# Patient Record
Sex: Male | Born: 1995 | Hispanic: Yes | Marital: Single | Smoking: Never smoker
Health system: Southern US, Community
[De-identification: ages and names within clinical notes are randomized; demographics above are authoritative.]

## PROBLEM LIST (undated history)

## (undated) ENCOUNTER — Emergency Department (HOSPITAL_COMMUNITY)

## (undated) ENCOUNTER — Ambulatory Visit (HOSPITAL_COMMUNITY): Payer: Self-pay

## (undated) DIAGNOSIS — J45909 Unspecified asthma, uncomplicated: Secondary | ICD-10-CM

## (undated) HISTORY — DX: Unspecified asthma, uncomplicated: J45.909

---
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
## 2016-04-17 ENCOUNTER — Ambulatory Visit (INDEPENDENT_AMBULATORY_CARE_PROVIDER_SITE_OTHER): Payer: Self-pay | Admitting: Physician Assistant

## 2016-04-17 ENCOUNTER — Encounter: Payer: Self-pay | Admitting: Physician Assistant

## 2016-04-17 VITALS — BP 125/69 | HR 82 | Ht 66.0 in | Wt 171.0 lb

## 2016-04-17 DIAGNOSIS — J4520 Mild intermittent asthma, uncomplicated: Secondary | ICD-10-CM | POA: Insufficient documentation

## 2016-04-17 DIAGNOSIS — J069 Acute upper respiratory infection, unspecified: Secondary | ICD-10-CM

## 2016-04-17 MED ORDER — ALBUTEROL SULFATE HFA 108 (90 BASE) MCG/ACT IN AERS: 1.0000 | INHALATION_SPRAY | RESPIRATORY_TRACT | Status: DC | PRN

## 2016-04-17 MED ORDER — AZITHROMYCIN 250 MG PO TABS: ORAL_TABLET | ORAL | Status: DC

## 2016-04-17 NOTE — Progress Notes (Signed)
   Subjective:    Patient ID: Charles Mcneil, male    DOB: [birth year], 20 y.o.   MRN: [id number]  HPI Pt is a 20 yo male who presents to the clinic with cough and wheezing. He has history of asthma. He uses a rescue inhaler as needed. He is using it more frequently this week. He is almost out of it. He denies any fever, chills, SOB. His chest does feel tight. Cough is productive with clear sputum. Not taking anything OTC. Symptoms for about 1 week. 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   .. Family History  Problem Relation Age of Onset  . Diabetes Mother   . Alcohol abuse Father    .Marland Kitchen. Social History   Social History  . Marital Status: Single    Spouse Name: N/A  . Number of Children: N/A  . Years of Education: N/A   Occupational History  . Not on file.   Social History Main Topics  . Smoking status: Never Smoker   . Smokeless tobacco: Not on file  . Alcohol Use: 0.0 oz/week    0 Standard drinks or equivalent per week  . Drug Use: Yes    Special: Marijuana  . Sexual Activity: Yes   Other Topics Concern  . Not on file   Social History Narrative  . No narrative on file      Review of Systems  All other systems reviewed and are negative.      Objective:   Physical Exam  Constitutional: He is oriented to person, place, and time. He appears well-developed and well-nourished.  HENT:  Head: Normocephalic and atraumatic.  Right Ear: External ear normal.  Left Ear: External ear normal.  Nose: Nose normal.  Mouth/Throat: Oropharynx is clear and moist. No oropharyngeal exudate.  TM's normal.   Eyes: Conjunctivae are normal. Right eye exhibits no discharge. Left eye exhibits no discharge.  Neck: Normal range of motion. Neck supple.  Cardiovascular: Normal rate, regular rhythm and normal heart sounds.   Pulmonary/Chest: Effort normal and breath sounds normal. He has no wheezes.  Lymphadenopathy:    He has no cervical adenopathy.   Neurological: He is alert and oriented to person, place, and time.  Psychiatric: He has a normal mood and affect. His behavior is normal.          Assessment & Plan:  Asthma, intermittent/acute URI- refilled albuterol inhaler. Discussed likely allergic from fiberglass. Consider claritin daily and mucinex as needed for next few days. If not improving zpak given to pull out in the next 2-3 days.

## 2016-04-17 NOTE — Patient Instructions (Signed)
mucinex to help with congestion and sinus pressure.  claritin to help with allergies  If not improving then use abx.

## 2018-07-24 ENCOUNTER — Encounter: Payer: Self-pay | Admitting: Physician Assistant

## 2018-07-24 ENCOUNTER — Ambulatory Visit (INDEPENDENT_AMBULATORY_CARE_PROVIDER_SITE_OTHER): Payer: Self-pay | Admitting: Physician Assistant

## 2018-07-24 VITALS — BP 126/76 | HR 71 | Ht 66.0 in | Wt 180.0 lb

## 2018-07-24 DIAGNOSIS — Z131 Encounter for screening for diabetes mellitus: Secondary | ICD-10-CM

## 2018-07-24 DIAGNOSIS — Z0000 Encounter for general adult medical examination without abnormal findings: Secondary | ICD-10-CM

## 2018-07-24 DIAGNOSIS — J4520 Mild intermittent asthma, uncomplicated: Secondary | ICD-10-CM

## 2018-07-24 DIAGNOSIS — Z13220 Encounter for screening for lipoid disorders: Secondary | ICD-10-CM

## 2018-07-24 DIAGNOSIS — N4889 Other specified disorders of penis: Secondary | ICD-10-CM

## 2018-07-24 MED ORDER — ALBUTEROL SULFATE HFA 108 (90 BASE) MCG/ACT IN AERS: 1.0000 | INHALATION_SPRAY | RESPIRATORY_TRACT | 2 refills | Status: AC | PRN

## 2018-07-24 MED ORDER — MONTELUKAST SODIUM 10 MG PO TABS: 10.0000 mg | ORAL_TABLET | Freq: Every day | ORAL | 3 refills | Status: DC

## 2018-07-24 NOTE — Progress Notes (Signed)
   Subjective:    Patient ID: Charles Mcneil, male    DOB: 1996/07/06, 22 y.o.   MRN: [id number]  HPI  Pt is a 22 yo male who presents to the clinic for CPE.   He has asthma and using inhaler for rescue 2-3 times a week.  He mentions small bumps around the head of penis. They do not hurt, itch or painful. Noticed since he was in middle school. Not tried anything to make better.  No other problems or concerns.   .. Family History  Problem Relation Age of Onset  . Diabetes Mother   . Alcohol abuse Father   . Hypertension Father      Review of Systems  All other systems reviewed and are negative.      Objective:   Physical Exam  Constitutional: He is oriented to person, place, and time. He appears well-developed and well-nourished.  HENT:  Head: Normocephalic and atraumatic.  Right Ear: External ear normal.  Left Ear: External ear normal.  Nose: Nose normal.  Mouth/Throat: Oropharynx is clear and moist.  Eyes: Pupils are equal, round, and reactive to light. Conjunctivae and EOM are normal.  Neck: Normal range of motion. Neck supple.  Cardiovascular: Normal rate and regular rhythm.  Pulmonary/Chest: Effort normal and breath sounds normal.  Abdominal: Soft. Bowel sounds are normal.  Genitourinary: No penile tenderness.  Genitourinary Comments: Small papule around head of penis.   Musculoskeletal: Normal range of motion.  Lymphadenopathy:    He has no cervical adenopathy.  Neurological: He is alert and oriented to person, place, and time.  Psychiatric: He has a normal mood and affect. His behavior is normal.          Assessment & Plan:  Marland Kitchen.Marland Kitchen.Diagnoses and all orders for this visit:  Routine physical examination -     Lipid Panel w/reflex Direct LDL -     COMPLETE METABOLIC PANEL WITH GFR  Screening for lipid disorders -     Lipid Panel w/reflex Direct LDL  Screening for diabetes mellitus -     COMPLETE METABOLIC PANEL WITH GFR  Mild intermittent  asthma without complication -     albuterol (PROVENTIL HFA;VENTOLIN HFA) 108 (90 Base) MCG/ACT inhaler; Inhale 1-2 puffs into the lungs every 4 (four) hours as needed for wheezing or shortness of breath. -     montelukast (SINGULAIR) 10 MG tablet; Take 1 tablet (10 mg total) by mouth at bedtime.  Pearly penile papules   .Marland Kitchen. Depression screen PHQ 2/9 07/24/2018  Decreased Interest 2  Down, Depressed, Hopeless 2  PHQ - 2 Score 4  Altered sleeping 1  Tired, decreased energy 1  Change in appetite 1  Feeling bad or failure about yourself  1  Trouble concentrating 0  Moving slowly or fidgety/restless 0  Suicidal thoughts 0  PHQ-9 Score 8  Difficult doing work/chores Somewhat difficult   .Marland Kitchen.Start a regular exercise program and make sure you are eating a healthy diet Try to eat 4 servings of dairy a day or take a calcium supplement (500mg  twice a day). Fasting labs ordered.  Declined vaccines today.   Using albuterol inhaler fairly often. Added singulair. He does work outside a lot. If not helping consider daily ICS. Follow up in 3 months.   Reassurance and HO given on benign nature of penile papules.

## 2018-07-24 NOTE — Patient Instructions (Signed)

## 2019-09-26 ENCOUNTER — Other Ambulatory Visit: Payer: Self-pay | Admitting: Physician Assistant

## 2019-09-26 DIAGNOSIS — J4520 Mild intermittent asthma, uncomplicated: Secondary | ICD-10-CM
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
## 2024-11-15 ENCOUNTER — Emergency Department (HOSPITAL_COMMUNITY)
Admission: EM | Admit: 2024-11-15 | Discharge: 2024-11-15 | Disposition: A | Discharge status: Home / Self Care | Attending: Emergency Medicine | Admitting: Emergency Medicine

## 2024-11-15 ENCOUNTER — Emergency Department (HOSPITAL_COMMUNITY)

## 2024-11-15 ENCOUNTER — Encounter (HOSPITAL_COMMUNITY): Payer: Self-pay

## 2024-11-15 ENCOUNTER — Other Ambulatory Visit: Payer: Self-pay

## 2024-11-15 DIAGNOSIS — S40812A Abrasion of left upper arm, initial encounter: Secondary | ICD-10-CM | POA: Diagnosis present

## 2024-11-15 DIAGNOSIS — R109 Unspecified abdominal pain: Secondary | ICD-10-CM | POA: Diagnosis not present

## 2024-11-15 DIAGNOSIS — R0789 Other chest pain: Secondary | ICD-10-CM | POA: Diagnosis not present

## 2024-11-15 DIAGNOSIS — Y92410 Unspecified street and highway as the place of occurrence of the external cause: Secondary | ICD-10-CM | POA: Insufficient documentation

## 2024-11-15 DIAGNOSIS — R519 Headache, unspecified: Secondary | ICD-10-CM | POA: Diagnosis not present

## 2024-11-15 DIAGNOSIS — S1091XA Abrasion of unspecified part of neck, initial encounter: Secondary | ICD-10-CM | POA: Insufficient documentation

## 2024-11-15 DIAGNOSIS — S0001XA Abrasion of scalp, initial encounter: Secondary | ICD-10-CM | POA: Diagnosis not present

## 2024-11-15 DIAGNOSIS — Z23 Encounter for immunization: Secondary | ICD-10-CM | POA: Insufficient documentation

## 2024-11-15 LAB — COMPREHENSIVE METABOLIC PANEL WITH GFR
ALT: 14 U/L (ref 0–44)
AST: 35 U/L (ref 15–41)
Albumin: 4.6 g/dL (ref 3.5–5.0)
Alkaline Phosphatase: 102 U/L (ref 38–126)
Anion gap: 13 (ref 5–15)
BUN: 11 mg/dL (ref 6–20)
CO2: 23 mmol/L (ref 22–32)
Calcium: 9.2 mg/dL (ref 8.9–10.3)
Chloride: 104 mmol/L (ref 98–111)
Creatinine, Ser: 0.99 mg/dL (ref 0.61–1.24)
GFR, Estimated: >60 mL/min
Glucose, Bld: 123 mg/dL — ABNORMAL HIGH (ref 70–99)
Potassium: 4.1 mmol/L (ref 3.5–5.1)
Sodium: 140 mmol/L (ref 135–145)
Total Bilirubin: 0.4 mg/dL (ref 0.0–1.2)
Total Protein: 8 g/dL (ref 6.5–8.1)

## 2024-11-15 LAB — CBC WITH DIFFERENTIAL/PLATELET
Basophils Absolute: 0.1 K/uL (ref 0.0–0.1)
Basophils Relative: 1 %
Eosinophils Absolute: 0.1 K/uL (ref 0.0–0.5)
Eosinophils Relative: 1 %
HCT: 47.2 % (ref 39.0–52.0)
Hemoglobin: 16.2 g/dL (ref 13.0–17.0)
Lymphocytes Relative: 21 %
Lymphs Abs: 1.5 K/uL (ref 0.7–4.0)
MCH: 28.3 pg (ref 26.0–34.0)
MCHC: 34.3 g/dL (ref 30.0–36.0)
MCV: 82.5 fL (ref 80.0–100.0)
Monocytes Absolute: 0.4 K/uL (ref 0.1–1.0)
Monocytes Relative: 6 %
Neutro Abs: 5.2 K/uL (ref 1.7–7.7)
Neutrophils Relative %: 71 %
Platelets: 179 K/uL (ref 150–400)
RBC: 5.72 MIL/uL (ref 4.22–5.81)
RDW: 12.6 % (ref 11.5–15.5)
WBC: 7.3 K/uL (ref 4.0–10.5)
nRBC: 0 % (ref 0.0–0.2)

## 2024-11-15 LAB — I-STAT CHEM 8, ED
BUN: 11 mg/dL (ref 6–20)
Calcium, Ion: 1.12 mmol/L — ABNORMAL LOW (ref 1.15–1.40)
Chloride: 106 mmol/L (ref 98–111)
Creatinine, Ser: 1 mg/dL (ref 0.61–1.24)
Glucose, Bld: 125 mg/dL — ABNORMAL HIGH (ref 70–99)
HCT: 47 % (ref 39.0–52.0)
Hemoglobin: 16 g/dL (ref 13.0–17.0)
Potassium: 3.9 mmol/L (ref 3.5–5.1)
Sodium: 142 mmol/L (ref 135–145)
TCO2: 23 mmol/L (ref 22–32)

## 2024-11-15 LAB — ETHANOL: Alcohol, Ethyl (B): <15 mg/dL

## 2024-11-15 LAB — I-STAT CG4 LACTIC ACID, ED
Lactic Acid, Venous: 1.5 mmol/L (ref 0.5–1.9)
Lactic Acid, Venous: 1.3 mmol/L (ref 0.5–1.9)

## 2024-11-15 MED ORDER — IOHEXOL 350 MG/ML SOLN
75.0000 mL | Freq: Once | INTRAVENOUS | Status: AC | PRN
  Administered 2024-11-15: 75 mL via INTRAVENOUS

## 2024-11-15 MED ORDER — LIDOCAINE-EPINEPHRINE 1 %-1:100000 IJ SOLN: 10.0000 mL | Freq: Once | INTRAMUSCULAR | Status: DC

## 2024-11-15 MED ORDER — ACETAMINOPHEN 500 MG PO TABS
1000.0000 mg | ORAL_TABLET | Freq: Once | ORAL | Status: AC
  Administered 2024-11-15: 1000 mg via ORAL
  Filled 2024-11-15: qty 2

## 2024-11-15 MED ORDER — TETANUS-DIPHTH-ACELL PERTUSSIS 5-2-15.5 LF-MCG/0.5 IM SUSP
0.5000 mL | Freq: Once | INTRAMUSCULAR | Status: AC
  Administered 2024-11-15: 0.5 mL via INTRAMUSCULAR
  Filled 2024-11-15: qty 0.5
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
## 2024-11-15 NOTE — Discharge Instructions (Signed)
 Return for any problem.  ?
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
## 2024-11-19 ENCOUNTER — Ambulatory Visit (HOSPITAL_COMMUNITY)
Admission: EM | Admit: 2024-11-19 | Discharge: 2024-11-19 | Disposition: A | Discharge status: Home / Self Care | Attending: Internal Medicine | Admitting: Internal Medicine

## 2024-11-19 ENCOUNTER — Encounter (HOSPITAL_COMMUNITY): Payer: Self-pay | Admitting: Emergency Medicine

## 2024-11-19 DIAGNOSIS — M5442 Lumbago with sciatica, left side: Secondary | ICD-10-CM | POA: Diagnosis not present

## 2024-11-19 DIAGNOSIS — M62830 Muscle spasm of back: Secondary | ICD-10-CM

## 2024-11-19 MED ORDER — BACLOFEN 10 MG PO TABS: 10.0000 mg | ORAL_TABLET | Freq: Three times a day (TID) | ORAL | 0 refills | Status: AC

## 2024-11-19 MED ORDER — NAPROXEN 500 MG PO TABS: 500.0000 mg | ORAL_TABLET | Freq: Two times a day (BID) | ORAL | 0 refills | Status: AC
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
## 2024-11-23 ENCOUNTER — Encounter: Payer: Self-pay | Admitting: Physician Assistant

## 2024-11-25 NOTE — Telephone Encounter (Signed)
 Patient is calling to check status of paperwork for work and requested to move current appointment on 12/04/23 to a sooner date in case he needs to be seen before paperwork is completed. I informed him that the forms have been printed and placed in Ms. Mila box and she will be the one to decide if she can complete them or if an appointment is needed, and that the turnaround time for paperwork is 7-10 business days. Patient decided to keep his current appointment and is requesting a call when there is an update on the paperwork status.

## 2024-12-03 ENCOUNTER — Ambulatory Visit: Admitting: Physician Assistant

## 2024-12-03 ENCOUNTER — Encounter: Payer: Self-pay | Admitting: Physician Assistant

## 2024-12-03 ENCOUNTER — Ambulatory Visit: Payer: Self-pay | Admitting: Physician Assistant

## 2024-12-03 ENCOUNTER — Ambulatory Visit

## 2024-12-03 VITALS — BP 128/90 | HR 87 | Ht 69.0 in | Wt 178.0 lb

## 2024-12-03 DIAGNOSIS — M25562 Pain in left knee: Secondary | ICD-10-CM

## 2024-12-03 DIAGNOSIS — Z789 Other specified health status: Secondary | ICD-10-CM

## 2024-12-03 DIAGNOSIS — F0781 Postconcussional syndrome: Secondary | ICD-10-CM | POA: Diagnosis not present

## 2024-12-03 DIAGNOSIS — R4789 Other speech disturbances: Secondary | ICD-10-CM | POA: Diagnosis not present

## 2024-12-03 DIAGNOSIS — L568 Other specified acute skin changes due to ultraviolet radiation: Secondary | ICD-10-CM

## 2024-12-03 DIAGNOSIS — Z113 Encounter for screening for infections with a predominantly sexual mode of transmission: Secondary | ICD-10-CM

## 2024-12-03 DIAGNOSIS — G4452 New daily persistent headache (NDPH): Secondary | ICD-10-CM

## 2024-12-03 DIAGNOSIS — H539 Unspecified visual disturbance: Secondary | ICD-10-CM | POA: Diagnosis not present

## 2024-12-03 DIAGNOSIS — Z7689 Persons encountering health services in other specified circumstances: Secondary | ICD-10-CM | POA: Diagnosis not present

## 2024-12-03 NOTE — Progress Notes (Signed)
 No fracture of left knee. Get patellar strap on [employer] and wear that for next 2 weeks or as needed to help with knee pain. Ice regularly twice a day.

## 2024-12-03 NOTE — Patient Instructions (Signed)
 Will order MRI to evaluate headaches and decreased processing.   Continue naproxen  and baclofen  as needed. Do not take baclofen  before work.   Consider icing knee and left shoulder.  Consider left knee strap to wear for support.  L knee xray ordered.   Post-Concussion Syndrome  A concussion is a brain injury from a direct hit to your head or body. This hit makes your brain shake fast in your skull. Normally, brain injuries are not life-threatening. Post-concussion syndrome is when symptoms last longer than normal after a head injury. What are the causes? The cause of this condition is not known. It can happen if your head injury was mild or very bad. What increases the risk? Being male. Being young. Having had a head injury before. Having had headaches a lot before. Being sad (depressed) or feeling worried or nervous (anxious). Having more than one symptom or very bad symptoms when you got injured. Fainting when you got your concussion or not being able to remember it. What are the signs or symptoms? After a head injury, you may have physical symptoms, such as: Headaches. Feeling tired, dizzy, or weak. Trouble seeing. Having eye trouble in bright lights. Trouble hearing. Problems with balance. You may also have symptoms that affect your thinking or your feelings, such as: Not being able to remember things. Not being able to focus. Trouble falling asleep or staying asleep (insomnia). Feeling grouchy (irritable). Feeling worried or sad. Trouble learning new things. Symptoms can last weeks or months. Sometimes, symptoms are serious. How is this treated? Treatment may depend on your symptoms. These normally go away on their own with time. If you need treatment, it may include: Medicines. Resting your brain and body for a few days. Doing therapy to help you heal (rehab therapy). This may include: Physical or occupational therapy. This may include exercises. Talking to a  counselor. Speech therapy. Therapy to help your eyes. Follow these instructions at home: Medicines Take over-the-counter and prescription medicines only as told by your doctor. Avoid pain medicines that have opioids in them. Activity Limit activities as told by your doctor. This may include not doing these things: Homework. Work for your job. Hard thinking. Watching TV. Using a computer or phone. Puzzles and games for your brain. Exercise and sports. Slowly return to your normal activities as told by your doctor. Slow down or stop an activity if you get symptoms. Rest. Try to sleep 7-9 hours each night. Also, try taking naps or breaks when you feel tired during the day. Do not do anything that could cause you to get hurt again right away. Getting hurt again can harm your brain. General instructions  Do not drink alcohol until your doctor says that you can. Keep track of your symptoms, and tell your doctor about them. Keep all follow-up visits. Your doctors may need to check you for new or serious symptoms. Where to find more information [employer]: concussionfoundation.org Contact a doctor if: You do not improve. You get injured again. You have changes in how you act. Get help right away if: You have a very bad headache or a headache that gets worse. You can't stop vomiting. Any part of your body feels weak or loses feeling. You feel mixed up (confused). You have trouble talking. You feel very sleepy. You faint or you have a seizure. These symptoms may be an emergency. Get help right away. Call 911. Do not wait to see if the symptoms will go away. Do not  drive yourself to the hospital. Also, get help right away if: You think about hurting yourself or others. Take one of these steps if you feel like you may hurt yourself or others, or have thoughts about taking your own life: Go to your nearest emergency room. Call 911. Call the National Suicide  Prevention Lifeline at 423-324-5539 or 988. This is open 24 hours a day. Text the Crisis Text Line at 562-269-8531. This information is not intended to replace advice given to you by your health care provider. Make sure you discuss any questions you have with your health care provider. Document Revised: 04/07/2022 Document Reviewed: 04/07/2022 Elsevier Patient Education  2024 [employer].
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
## 2024-12-05 ENCOUNTER — Encounter: Payer: Self-pay | Admitting: Physician Assistant

## 2024-12-05 DIAGNOSIS — Z789 Other specified health status: Secondary | ICD-10-CM | POA: Insufficient documentation

## 2024-12-05 DIAGNOSIS — H539 Unspecified visual disturbance: Secondary | ICD-10-CM | POA: Insufficient documentation

## 2024-12-05 DIAGNOSIS — L568 Other specified acute skin changes due to ultraviolet radiation: Secondary | ICD-10-CM | POA: Insufficient documentation

## 2024-12-05 DIAGNOSIS — R4789 Other speech disturbances: Secondary | ICD-10-CM | POA: Insufficient documentation

## 2024-12-24 ENCOUNTER — Encounter: Payer: Self-pay | Admitting: Physician Assistant

## 2024-12-24 ENCOUNTER — Ambulatory Visit: Admitting: Physician Assistant

## 2024-12-24 VITALS — BP 133/95 | HR 96 | Ht 69.0 in | Wt 185.0 lb

## 2024-12-24 DIAGNOSIS — F419 Anxiety disorder, unspecified: Secondary | ICD-10-CM | POA: Diagnosis not present

## 2024-12-24 DIAGNOSIS — F32A Depression, unspecified: Secondary | ICD-10-CM

## 2024-12-24 DIAGNOSIS — Z0000 Encounter for general adult medical examination without abnormal findings: Secondary | ICD-10-CM

## 2024-12-24 DIAGNOSIS — Z23 Encounter for immunization: Secondary | ICD-10-CM

## 2024-12-24 DIAGNOSIS — Z1159 Encounter for screening for other viral diseases: Secondary | ICD-10-CM

## 2024-12-24 DIAGNOSIS — Z131 Encounter for screening for diabetes mellitus: Secondary | ICD-10-CM

## 2024-12-24 DIAGNOSIS — R519 Headache, unspecified: Secondary | ICD-10-CM | POA: Diagnosis not present

## 2024-12-24 DIAGNOSIS — Z13220 Encounter for screening for lipoid disorders: Secondary | ICD-10-CM | POA: Diagnosis not present

## 2024-12-24 NOTE — Patient Instructions (Signed)
 Written out from 12/20 to 1/2.   Health Maintenance, Male Adopting a healthy lifestyle and getting preventive care are important in promoting health and wellness. Ask your health care provider about: The right schedule for you to have regular tests and exams. Things you can do on your own to prevent diseases and keep yourself healthy. What should I know about diet, weight, and exercise? Eat a healthy diet  Eat a diet that includes plenty of vegetables, fruits, low-fat dairy products, and lean protein. Do not eat a lot of foods that are high in solid fats, added sugars, or sodium. Maintain a healthy weight Body mass index (BMI) is a measurement that can be used to identify possible weight problems. It estimates body fat based on height and weight. Your health care provider can help determine your BMI and help you achieve or maintain a healthy weight. Get regular exercise Get regular exercise. This is one of the most important things you can do for your health. Most adults should: Exercise for at least 150 minutes each week. The exercise should increase your heart rate and make you sweat (moderate-intensity exercise). Do strengthening exercises at least twice a week. This is in addition to the moderate-intensity exercise. Spend less time sitting. Even light physical activity can be beneficial. Watch cholesterol and blood lipids Have your blood tested for lipids and cholesterol at 30 years of age, then have this test every 5 years. You may need to have your cholesterol levels checked more often if: Your lipid or cholesterol levels are high. You are older than 30 years of age. You are at high risk for heart disease. What should I know about cancer screening? Many types of cancers can be detected early and may often be prevented. Depending on your health history and family history, you may need to have cancer screening at various ages. This may include screening for: Colorectal cancer. Prostate  cancer. Skin cancer. Lung cancer. What should I know about heart disease, diabetes, and high blood pressure? Blood pressure and heart disease High blood pressure causes heart disease and increases the risk of stroke. This is more likely to develop in people who have high blood pressure readings or are overweight. Talk with your health care provider about your target blood pressure readings. Have your blood pressure checked: Every 3-5 years if you are 12-30 years of age. Every year if you are 30 years old or older. If you are between the ages of 23 and 6 and are a current or former smoker, ask your health care provider if you should have a one-time screening for abdominal aortic aneurysm (AAA). Diabetes Have regular diabetes screenings. This checks your fasting blood sugar level. Have the screening done: Once every three years after age 49 if you are at a normal weight and have a low risk for diabetes. More often and at a younger age if you are overweight or have a high risk for diabetes. What should I know about preventing infection? Hepatitis B If you have a higher risk for hepatitis B, you should be screened for this virus. Talk with your health care provider to find out if you are at risk for hepatitis B infection. Hepatitis C Blood testing is recommended for: Everyone born from [birth year] through 1966. Anyone with known risk factors for hepatitis C. Sexually transmitted infections (STIs) You should be screened each year for STIs, including gonorrhea and chlamydia, if: You are sexually active and are younger than 30 years of age. You are  older than 30 years of age and your health care provider tells you that you are at risk for this type of infection. Your sexual activity has changed since you were last screened, and you are at increased risk for chlamydia or gonorrhea. Ask your health care provider if you are at risk. Ask your health care provider about whether you are at high risk for HIV.  Your health care provider may recommend a prescription medicine to help prevent HIV infection. If you choose to take medicine to prevent HIV, you should first get tested for HIV. You should then be tested every 3 months for as long as you are taking the medicine. Follow these instructions at home: Alcohol use Do not drink alcohol if your health care provider tells you not to drink. If you drink alcohol: Limit how much you have to 0-2 drinks a day. Know how much alcohol is in your drink. In the [place] one drink equals one 12 oz bottle of beer (355 mL), one 5 oz glass of wine (148 mL), or one 1 oz glass of hard liquor (44 mL). Lifestyle Do not use any products that contain nicotine or tobacco. These products include cigarettes, chewing tobacco, and vaping devices, such as e-cigarettes. If you need help quitting, ask your health care provider. Do not use street drugs. Do not share needles. Ask your health care provider for help if you need support or information about quitting drugs. General instructions Schedule regular health, dental, and eye exams. Stay current with your vaccines. Tell your health care provider if: You often feel depressed. You have ever been abused or do not feel safe at home. Summary Adopting a healthy lifestyle and getting preventive care are important in promoting health and wellness. Follow your health care provider's instructions about healthy diet, exercising, and getting tested or screened for diseases. Follow your health care provider's instructions on monitoring your cholesterol and blood pressure. This information is not intended to replace advice given to you by your health care provider. Make sure you discuss any questions you have with your health care provider. Document Revised: 04/04/2021 Document Reviewed: 04/04/2021 Elsevier Patient Education  2024 [employer].

## 2024-12-24 NOTE — Progress Notes (Deleted)
 "  Complete physical exam  Patient: Charles Mcneil   DOB: [birth year]   29 y.o. Male  MRN: [id number]  Subjective:    Chief Complaint  Patient presents with   Annual Exam    Charles Mcneil is a 29 y.o. male who presents today for a complete physical exam. He reports consuming a {diet types:17450} diet. {types:19826} He generally feels {DESC; WELL/FAIRLY WELL/POORLY:18703}. He reports sleeping {DESC; WELL/FAIRLY WELL/POORLY:18703}. He {does/does not:200015} have additional problems to discuss today.    Most recent fall risk assessment:     No data to display           Most recent depression screenings:    07/24/2018    8:38 AM  PHQ 2/9 Scores  PHQ - 2 Score 4  PHQ- 9 Score 8      Data saved with a previous flowsheet row definition    {VISON DENTAL STD PSA (Optional):27386}  {History (Optional):23778}  Patient Care Team: Antoniette Vermell LITTIE DEVONNA as PCP - General (Family Medicine)   Show/hide medication list[1]  ROS        Objective:     BP (!) 133/95   Pulse 96   Ht 5' 9 (1.753 m)   Wt 185 lb (83.9 kg)   SpO2 98%   BMI 27.32 kg/m  {Vitals History (Optional):23777}  Physical Exam   No results found for any visits on 12/24/24. {Show previous labs (optional):23779}    Assessment & Plan:    Routine Health Maintenance and Physical Exam  Immunization History  Administered Date(s) Administered   PPD Test 02/28/2017   Tdap 11/15/2024    Health Maintenance  Topic Date Due   Hepatitis C Screening  Never done   COVID-19 Vaccine (1) 01/09/2025 (Originally 06[birth year]20/1995)   Influenza Vaccine  02/24/2025 (Originally 06/27/2024)   Pneumococcal Vaccine (1 of 2 - PCV) 12/03/2025 (Originally 12/20/2013)   Hepatitis B Vaccines 19-59 Average Risk (1 of 3 - 19+ 3-dose series) 12/03/2025 (Originally 12/20/2013)   HIV Screening  12/03/2025 (Originally 12/20/2009)   DTaP/Tdap/Td (2 - Td or Tdap) 11/15/2034   HPV VACCINES (No Doses Required)  Completed   Meningococcal B Vaccine  Aged Out    Discussed health benefits of physical activity, and encouraged him to engage in regular exercise appropriate for his age and condition.  Problem List Items Addressed This Visit   None Visit Diagnoses       Routine physical examination    -  Primary   Relevant Orders   CBC with Differential/Platelet   CMP14+EGFR   Lipid panel   TSH   VITAMIN D 25 Hydroxy (Vit-D Deficiency, Fractures)   Hepatitis C Antibody     Screening for diabetes mellitus       Relevant Orders   CMP14+EGFR     Screening for lipid disorders       Relevant Orders   Lipid panel     Encounter for hepatitis C screening test for low risk patient       Relevant Orders   Hepatitis C Antibody      No follow-ups on file.     [name], PA-C      [1]  Outpatient Medications Prior to Visit  Medication Sig   albuterol  (PROVENTIL  HFA;VENTOLIN  HFA) 108 (90 Base) MCG/ACT inhaler Inhale 1-2 puffs into the lungs every 4 (four) hours as needed for wheezing or shortness of breath.   baclofen  (LIORESAL ) 10 MG tablet Take 1 tablet (10 mg total) by mouth 3 (  three) times daily.   naproxen  (NAPROSYN ) 500 MG tablet Take 1 tablet (500 mg total) by mouth 2 (two) times daily.   No facility-administered medications prior to visit.   "

## 2024-12-25 LAB — CBC WITH DIFFERENTIAL/PLATELET
Basophils Absolute: 0 10*3/uL (ref 0.0–0.2)
Basos: 0 %
EOS (ABSOLUTE): 0.3 10*3/uL (ref 0.0–0.4)
Eos: 3 %
Hematocrit: 48 % (ref 37.5–51.0)
Hemoglobin: 15.7 g/dL (ref 13.0–17.7)
Immature Grans (Abs): 0 10*3/uL (ref 0.0–0.1)
Immature Granulocytes: 0 %
Lymphocytes Absolute: 3.3 10*3/uL — ABNORMAL HIGH (ref 0.7–3.1)
Lymphs: 32 %
MCH: 28.9 pg (ref 26.6–33.0)
MCHC: 32.7 g/dL (ref 31.5–35.7)
MCV: 88 fL (ref 79–97)
Monocytes Absolute: 1 10*3/uL — ABNORMAL HIGH (ref 0.1–0.9)
Monocytes: 9 %
Neutrophils Absolute: 5.9 10*3/uL (ref 1.4–7.0)
Neutrophils: 56 %
Platelets: 266 10*3/uL (ref 150–450)
RBC: 5.43 x10E6/uL (ref 4.14–5.80)
RDW: 13.6 % (ref 11.6–15.4)
WBC: 10.4 10*3/uL (ref 3.4–10.8)

## 2024-12-25 LAB — LIPID PANEL
Chol/HDL Ratio: 5.7 ratio — ABNORMAL HIGH (ref 0.0–5.0)
Cholesterol, Total: 210 mg/dL — ABNORMAL HIGH (ref 100–199)
HDL: 37 mg/dL — ABNORMAL LOW
LDL Chol Calc (NIH): 82 mg/dL (ref 0–99)
Triglycerides: 564 mg/dL (ref 0–149)
VLDL Cholesterol Cal: 91 mg/dL — ABNORMAL HIGH (ref 5–40)

## 2024-12-25 LAB — CMP14+EGFR
ALT: 29 [IU]/L (ref 0–44)
AST: 37 [IU]/L (ref 0–40)
Albumin: 4.6 g/dL (ref 4.3–5.2)
Alkaline Phosphatase: 102 [IU]/L (ref 47–123)
BUN/Creatinine Ratio: 13 (ref 9–20)
BUN: 12 mg/dL (ref 6–20)
Bilirubin Total: <0.2 mg/dL (ref 0.0–1.2)
CO2: 22 mmol/L (ref 20–29)
Calcium: 9.1 mg/dL (ref 8.7–10.2)
Chloride: 102 mmol/L (ref 96–106)
Creatinine, Ser: 0.91 mg/dL (ref 0.76–1.27)
Globulin, Total: 2.8 g/dL (ref 1.5–4.5)
Glucose: 95 mg/dL (ref 70–99)
Potassium: 3.9 mmol/L (ref 3.5–5.2)
Sodium: 141 mmol/L (ref 134–144)
Total Protein: 7.4 g/dL (ref 6.0–8.5)
eGFR: 116 mL/min/{1.73_m2}

## 2024-12-25 LAB — TSH: TSH: 2.35 u[IU]/mL (ref 0.450–4.500)

## 2024-12-25 LAB — HEPATITIS C ANTIBODY: Hep C Virus Ab: NONREACTIVE

## 2024-12-25 LAB — VITAMIN D 25 HYDROXY (VIT D DEFICIENCY, FRACTURES): Vit D, 25-Hydroxy: <4 ng/mL — ABNORMAL LOW (ref 30.0–100.0)

## 2024-12-26 ENCOUNTER — Ambulatory Visit: Payer: Self-pay | Admitting: Physician Assistant

## 2024-12-26 DIAGNOSIS — E559 Vitamin D deficiency, unspecified: Secondary | ICD-10-CM | POA: Insufficient documentation

## 2024-12-26 DIAGNOSIS — E781 Pure hyperglyceridemia: Secondary | ICD-10-CM | POA: Insufficient documentation

## 2024-12-26 MED ORDER — FENOFIBRATE 145 MG PO TABS: 145.0000 mg | ORAL_TABLET | Freq: Every day | ORAL | 3 refills | Status: AC

## 2024-12-26 NOTE — Progress Notes (Signed)
 Sent to take once a day. Recheck TG in 6 months.

## 2024-12-26 NOTE — Progress Notes (Signed)
 Charles Mcneil,   Your vitamin D is very low! You need to take 2000 units daily with diary for better absorption and recheck in 6 months.   Triglycerides are really elevated. Avoid processed foods, sugars, fried foods. I would like to start you on medication that can help decrease you TG. Are you ok with this? Its called Tricor .

## 2024-12-30 ENCOUNTER — Encounter: Payer: Self-pay | Admitting: Physician Assistant

## 2024-12-30 DIAGNOSIS — F32A Depression, unspecified: Secondary | ICD-10-CM | POA: Insufficient documentation

## 2025-01-01 NOTE — Telephone Encounter (Signed)
 Spoke to patient, informed that FMLA Paperwork has been faxed and is ready for pick up, thanks.

## 2025-12-25 ENCOUNTER — Encounter: Admitting: Physician Assistant
